# Patient Record
Sex: Female | Born: 1940 | Race: White | Hispanic: No | Marital: Married | Smoking: Never smoker
Health system: Southern US, Community
[De-identification: ages and names within clinical notes are randomized; demographics above are authoritative.]

---

## 2022-05-02 ENCOUNTER — Emergency Department (HOSPITAL_BASED_OUTPATIENT_CLINIC_OR_DEPARTMENT_OTHER)
Admission: EM | Admit: 2022-05-02 | Discharge: 2022-05-02 | Disposition: A | Discharge status: Home / Self Care | Payer: Medicare PPO | Attending: Emergency Medicine | Admitting: Emergency Medicine

## 2022-05-02 ENCOUNTER — Encounter (HOSPITAL_BASED_OUTPATIENT_CLINIC_OR_DEPARTMENT_OTHER): Payer: Self-pay | Admitting: Obstetrics and Gynecology

## 2022-05-02 ENCOUNTER — Emergency Department (HOSPITAL_BASED_OUTPATIENT_CLINIC_OR_DEPARTMENT_OTHER): Payer: Medicare PPO

## 2022-05-02 ENCOUNTER — Other Ambulatory Visit: Payer: Self-pay

## 2022-05-02 DIAGNOSIS — S0990XA Unspecified injury of head, initial encounter: Secondary | ICD-10-CM | POA: Diagnosis not present

## 2022-05-02 DIAGNOSIS — R0789 Other chest pain: Secondary | ICD-10-CM | POA: Insufficient documentation

## 2022-05-02 DIAGNOSIS — S6992XA Unspecified injury of left wrist, hand and finger(s), initial encounter: Secondary | ICD-10-CM | POA: Diagnosis present

## 2022-05-02 DIAGNOSIS — Y92410 Unspecified street and highway as the place of occurrence of the external cause: Secondary | ICD-10-CM | POA: Insufficient documentation

## 2022-05-02 DIAGNOSIS — S60312A Abrasion of left thumb, initial encounter: Secondary | ICD-10-CM | POA: Diagnosis not present

## 2022-05-02 DIAGNOSIS — Z23 Encounter for immunization: Secondary | ICD-10-CM | POA: Insufficient documentation

## 2022-05-02 MED ORDER — TETANUS-DIPHTH-ACELL PERTUSSIS 5-2.5-18.5 LF-MCG/0.5 IM SUSY
0.5000 mL | PREFILLED_SYRINGE | Freq: Once | INTRAMUSCULAR | Status: AC
  Administered 2022-05-02: 0.5 mL via INTRAMUSCULAR
  Filled 2022-05-02: qty 0.5
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
## 2022-05-02 NOTE — Discharge Instructions (Addendum)
Your CT imaging was negative for acute traumatic injuries, full CT report below: IMPRESSION:  1. Mild induration within the subcutaneous soft tissues of the right  upper chest wall, likely soft tissue contusion. No hematoma.  2. Otherwise, no acute findings within the chest.  3. Few scattered tiny pulmonary micronodules, largest within the  left upper lobe measuring up to 3 mm. No follow-up needed if patient  is low-risk (and has no known or suspected primary neoplasm).  Non-contrast chest CT can be considered in 12 months if patient is  high-risk. This recommendation follows the consensus statement:  Guidelines for Management of Incidental Pulmonary Nodules Detected  on CT Images: From the Fleischner Society 2017; Radiology 2017;  284:228-243.  4. Aortic and coronary artery atherosclerosis (ICD10-I70.0).   IMPRESSION:  Degenerative disc disease in the cervical spine without acute  fracture or subluxation.   IMPRESSION:  No acute intracranial abnormality. No skull fracture.

## 2022-05-02 NOTE — ED Notes (Signed)
Reviewed AVS/discharge instruction with patient. Time allotted for and all questions answered. Patient is agreeable for d/c and escorted to ed exit by staff.  
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
